# Patient Record
Sex: Male | Born: 1965 | Race: White | Hispanic: No | Marital: Married | Smoking: Never smoker
Health system: Southern US, Community
[De-identification: ages and names within clinical notes are randomized; demographics above are authoritative.]

## PROBLEM LIST (undated history)

## (undated) DIAGNOSIS — I10 Essential (primary) hypertension: Secondary | ICD-10-CM

## (undated) HISTORY — DX: Essential (primary) hypertension: I10

## (undated) HISTORY — PX: OTHER SURGICAL HISTORY: SHX169

---

## 2016-05-03 ENCOUNTER — Emergency Department (HOSPITAL_COMMUNITY): Payer: Managed Care, Other (non HMO)

## 2016-05-03 ENCOUNTER — Encounter (HOSPITAL_COMMUNITY): Payer: Self-pay | Admitting: *Deleted

## 2016-05-03 ENCOUNTER — Emergency Department (HOSPITAL_COMMUNITY)
Admission: EM | Admit: 2016-05-03 | Discharge: 2016-05-03 | Disposition: A | Discharge status: Home / Self Care | Payer: Managed Care, Other (non HMO) | Attending: Emergency Medicine | Admitting: Emergency Medicine

## 2016-05-03 DIAGNOSIS — R1032 Left lower quadrant pain: Secondary | ICD-10-CM | POA: Diagnosis present

## 2016-05-03 DIAGNOSIS — Z79899 Other long term (current) drug therapy: Secondary | ICD-10-CM | POA: Insufficient documentation

## 2016-05-03 DIAGNOSIS — N201 Calculus of ureter: Secondary | ICD-10-CM | POA: Diagnosis not present

## 2016-05-03 LAB — CBC
HEMATOCRIT: 48.1 % (ref 39.0–52.0)
HEMOGLOBIN: 16.9 g/dL (ref 13.0–17.0)
MCH: 30 pg (ref 26.0–34.0)
MCHC: 35.1 g/dL (ref 30.0–36.0)
MCV: 85.4 fL (ref 78.0–100.0)
Platelets: 211 10*3/uL (ref 150–400)
RBC: 5.63 MIL/uL (ref 4.22–5.81)
RDW: 13.8 % (ref 11.5–15.5)
WBC: 8.7 10*3/uL (ref 4.0–10.5)

## 2016-05-03 LAB — URINALYSIS, ROUTINE W REFLEX MICROSCOPIC
BACTERIA UA: NONE SEEN
Bilirubin Urine: NEGATIVE
GLUCOSE, UA: NEGATIVE mg/dL
KETONES UR: NEGATIVE mg/dL
Leukocytes, UA: NEGATIVE
Nitrite: NEGATIVE
PROTEIN: NEGATIVE mg/dL
Specific Gravity, Urine: 1.016 (ref 1.005–1.030)
pH: 5 (ref 5.0–8.0)

## 2016-05-03 LAB — COMPREHENSIVE METABOLIC PANEL
ALBUMIN: 4.3 g/dL (ref 3.5–5.0)
ALK PHOS: 56 U/L (ref 38–126)
ALT: 22 U/L (ref 17–63)
ANION GAP: 14 (ref 5–15)
AST: 24 U/L (ref 15–41)
BILIRUBIN TOTAL: 2 mg/dL — AB (ref 0.3–1.2)
BUN: 10 mg/dL (ref 6–20)
CALCIUM: 9.5 mg/dL (ref 8.9–10.3)
CO2: 18 mmol/L — ABNORMAL LOW (ref 22–32)
CREATININE: 1.05 mg/dL (ref 0.61–1.24)
Chloride: 109 mmol/L (ref 101–111)
GFR calc Af Amer: >60 mL/min (ref >60)
GFR calc non Af Amer: >60 mL/min (ref >60)
GLUCOSE: 136 mg/dL — AB (ref 65–99)
Potassium: 4.1 mmol/L (ref 3.5–5.1)
SODIUM: 141 mmol/L (ref 135–145)
Total Protein: 6.9 g/dL (ref 6.5–8.1)

## 2016-05-03 LAB — LIPASE, BLOOD: Lipase: 41 U/L (ref 11–51)

## 2016-05-03 MED ORDER — ONDANSETRON HCL 4 MG/2ML IJ SOLN
4.0000 mg | Freq: Once | INTRAMUSCULAR | Status: AC
  Administered 2016-05-03: 4 mg via INTRAVENOUS
  Filled 2016-05-03: qty 2

## 2016-05-03 MED ORDER — OXYCODONE-ACETAMINOPHEN 5-325 MG PO TABS: 1.0000 | ORAL_TABLET | Freq: Four times a day (QID) | ORAL | 0 refills | Status: DC | PRN

## 2016-05-03 MED ORDER — TAMSULOSIN HCL 0.4 MG PO CAPS: 0.4000 mg | ORAL_CAPSULE | Freq: Every day | ORAL | 0 refills | Status: DC

## 2016-05-03 MED ORDER — ONDANSETRON 4 MG PO TBDP: 4.0000 mg | ORAL_TABLET | Freq: Three times a day (TID) | ORAL | 0 refills | Status: DC | PRN

## 2016-05-03 MED ORDER — HYDROMORPHONE HCL 2 MG/ML IJ SOLN
1.0000 mg | Freq: Once | INTRAMUSCULAR | Status: AC
  Administered 2016-05-03: 1 mg via INTRAVENOUS
  Filled 2016-05-03: qty 1

## 2016-05-03 MED ORDER — FENTANYL CITRATE (PF) 100 MCG/2ML IJ SOLN
50.0000 ug | INTRAMUSCULAR | Status: DC | PRN
  Administered 2016-05-03: 50 ug via INTRAVENOUS

## 2016-05-03 MED ORDER — FENTANYL CITRATE (PF) 100 MCG/2ML IJ SOLN
INTRAMUSCULAR | Status: AC
  Filled 2016-05-03: qty 2

## 2016-05-03 NOTE — ED Notes (Signed)
Pt returned from CT °

## 2016-05-03 NOTE — ED Provider Notes (Signed)
[facility]   CSN: [id number] Arrival date & time: 05/03/16  0749     History   Chief Complaint Chief Complaint  Patient presents with  . Abdominal Pain    HPI Cameron Mccormick is a 51 y.o. male.  HPI  Pt presenting with c/o acute onset of left lower abdominal pain.  Pain started at approx 530am and has been constant since that time.  Pt states he cannot get comfortable.  Denies dysuria or difficulty urinating.  Has had one episode of emesis.  No fever/chills.  Has not had similar pain in the past.  There are no other associated systemic symptoms, there are no other alleviating or modifying factors. Pain is sharp and stabbing. He has not had any treatment prior to arrival.    History reviewed. No pertinent past medical history.  There are no active problems to display for this patient.   No past surgical history on file.     Home Medications    Prior to Admission medications   Medication Sig Start Date End Date Taking? Authorizing Provider  Fexofenadine-Pseudoephedrine (ALLEGRA-D PO) Take 1 tablet by mouth daily.   Yes Historical Provider, MD  fluticasone (FLONASE) 50 MCG/ACT nasal spray Place 1 spray into both nostrils daily.   Yes Historical Provider, MD  ranitidine (ZANTAC) 150 MG tablet Take 150 mg by mouth daily as needed for heartburn.   Yes Historical Provider, MD  ondansetron (ZOFRAN ODT) 4 MG disintegrating tablet Take 1 tablet (4 mg total) by mouth every 8 (eight) hours as needed. 05/03/16   Jerelyn ScottMartha Linker, MD  oxyCODONE-acetaminophen (PERCOCET/ROXICET) 5-325 MG tablet Take 1-2 tablets by mouth every 6 (six) hours as needed for severe pain. 05/03/16   Jerelyn ScottMartha Linker, MD  tamsulosin (FLOMAX) 0.4 MG CAPS capsule Take 1 capsule (0.4 mg total) by mouth daily. 05/03/16   Jerelyn ScottMartha Linker, MD    Family History No family history on file.  Social History Social History  Substance Use Topics  . Smoking status: Not on file  . Smokeless tobacco: Not  on file  . Alcohol use Not on file     Allergies   Patient has no known allergies.   Review of Systems Review of Systems  ROS reviewed and all otherwise negative except for mentioned in HPI   Physical Exam Updated Vital Signs BP 133/94 (BP Location: Left Arm)   Pulse 72   Temp 97.9 F (36.6 C) (Oral)   Resp 18   SpO2 97%  Vitals reviewed Physical Exam Physical Examination: General appearance - alert, uncomfortableappearing, writhing in pain, and in no distress Mental status - alert, oriented to person, place, and time Eyes -no conjuntival injection, no scleral icterus Chest - clear to auscultation, no wheezes, rales or rhonchi, symmetric air entry Heart - normal rate, regular rhythm, normal S1, S2, no murmurs, rubs, clicks or gallops Abdomen - soft, nontender, nondistended, no masses or organomegaly Neurological - alert, oriented, normal speech,  Extremities - peripheral pulses normal, no pedal edema, no clubbing or cyanosis Skin - normal coloration and turgor, no rashes  ED Treatments / Results  Labs (all labs ordered are listed, but only abnormal results are displayed) Labs Reviewed  COMPREHENSIVE METABOLIC PANEL - Abnormal; Notable for the following:       Result Value   CO2 18 (*)    Glucose, Bld 136 (*)    Total Bilirubin 2.0 (*)    All other components within normal limits  URINALYSIS, ROUTINE W REFLEX  MICROSCOPIC - Abnormal; Notable for the following:    Hgb urine dipstick MODERATE (*)    Squamous Epithelial / LPF 0-5 (*)    All other components within normal limits  URINE CULTURE  CBC  LIPASE, BLOOD    EKG  EKG Interpretation None       Radiology Ct Renal Stone Study  Result Date: 05/03/2016 CLINICAL DATA:  Left lower quadrant pain. EXAM: CT ABDOMEN AND PELVIS WITHOUT CONTRAST TECHNIQUE: Multidetector CT imaging of the abdomen and pelvis was performed following the standard protocol without IV contrast. COMPARISON:  None. FINDINGS: Lower chest: 5  mm nodule in the right lung base posteriorly. Dependent atelectasis bilaterally. Heart is borderline in size. No effusions. Hepatobiliary: No focal hepatic abnormality. Gallbladder unremarkable. Pancreas: No focal abnormality or ductal dilatation. Spleen: No focal abnormality.  Normal size. Adrenals/Urinary Tract: Mild fullness of the left renal collecting system and ureter. No visible renal or ureteral stones bilaterally. Adrenal glands and urinary bladder are unremarkable. Stomach/Bowel: Normal appendix. Stomach, large and small bowel grossly unremarkable. Vascular/Lymphatic: No evidence of aneurysm or adenopathy. Reproductive: No visible focal abnormality. Other: No free fluid or free air. Musculoskeletal: No acute bony abnormality. IMPRESSION: Mild fullness of the left renal collecting system and ureter without visible renal or ureteral stones. Question recent passage of stone. 5 mm right lower lobe pulmonary nodule. No follow-up needed if patient is low-risk. Non-contrast chest CT can be considered in 12 months if patient is high-risk. This recommendation follows the consensus statement: Guidelines for Management of Incidental Pulmonary Nodules Detected on CT Images: From the Fleischner Society 2017; Radiology 2017; 284:228-243. Electronically Signed   By: Charlett Nose M.D.   On: 05/03/2016 09:52    Procedures Procedures (including critical care time)  Medications Ordered in ED Medications  HYDROmorphone (DILAUDID) injection 1 mg (1 mg Intravenous Given 05/03/16 0833)  ondansetron (ZOFRAN) injection 4 mg (4 mg Intravenous Given 05/03/16 1610)     Initial Impression / Assessment and Plan / ED Course  I have reviewed the triage vital signs and the nursing notes.  Pertinent labs & imaging results that were available during my care of the patient were reviewed by me and considered in my medical decision making (see chart for details).     Pt presenting with acute onset of sharp left lower  abdominal pain, labs reveal rbcs in urine, otherwise reassuring. CT scan shows fullness of left collecting system possibly small stone or has passed stone.  On multiple rechecks patient feels much improved after meds.  Given information for urology follwoup, pain and nuasea meds for home use.  Discharged with strict return precautions.  Pt agreeable with plan.  Final Clinical Impressions(s) / ED Diagnoses   Final diagnoses:  Left ureteral stone    New Prescriptions Discharge Medication List as of 05/03/2016 12:08 PM    START taking these medications   Details  ondansetron (ZOFRAN ODT) 4 MG disintegrating tablet Take 1 tablet (4 mg total) by mouth every 8 (eight) hours as needed., Starting Sat 05/03/2016, Print    oxyCODONE-acetaminophen (PERCOCET/ROXICET) 5-325 MG tablet Take 1-2 tablets by mouth every 6 (six) hours as needed for severe pain., Starting Sat 05/03/2016, Print    tamsulosin (FLOMAX) 0.4 MG CAPS capsule Take 1 capsule (0.4 mg total) by mouth daily., Starting Sat 05/03/2016, Print         Jerelyn Scott, MD 05/04/16 [id number]

## 2016-05-03 NOTE — Discharge Instructions (Signed)
Return to the ED with any concerns including worsening pain that is not controlled by pain medications, vomiting and not able to keep down liquids, fever/chills, decreased level of alertness/lethargy, or any other aalrming symptoms   5 mm right lower lobe pulmonary nodule. No follow-up needed if patient is low-risk. Non-contrast chest CT can be considered in 12 months if patient is high-risk. This recommendation follows the consensus statement: Guidelines for Management of Incidental Pulmonary Nodules Detected on CT Images: From the Fleischner Society 2017; Radiology 2017; 284:228-243.

## 2016-05-03 NOTE — ED Notes (Signed)
ED Provider at bedside. 

## 2016-05-03 NOTE — ED Triage Notes (Signed)
Pt. Having LLQ abdominal pain.  Pain started at 530 this morning.  Pt. Very uncomfortable in triage.

## 2016-05-03 NOTE — ED Notes (Signed)
Janan HalterJamie Blue, Charge RN instructed us to place pt. In [address]

## 2016-05-03 NOTE — ED Notes (Signed)
Pt ambulated to restroom  Without difficulty.

## 2016-05-04 LAB — URINE CULTURE

## 2018-06-16 IMAGING — CT CT RENAL STONE PROTOCOL
2 of 4 series · 11 of 46 positions shown, 12 images · non-contrast
Comparison: None.

CLINICAL DATA: Left lower quadrant pain.

EXAM:
CT ABDOMEN AND PELVIS WITHOUT CONTRAST
TECHNIQUE: Multidetector CT imaging of the abdomen and pelvis was performed
following the standard protocol without IV contrast.

[Series 301: stone study, idose (2) · axial · 0.71mm/px · z∈[-860,-405]mm · 8 of 105 slices shown, 9 images]
[im 7/105  soft-tissue]
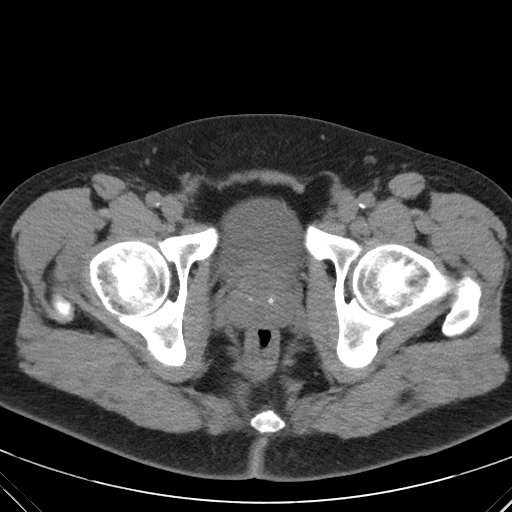
[im 7/105  bone]
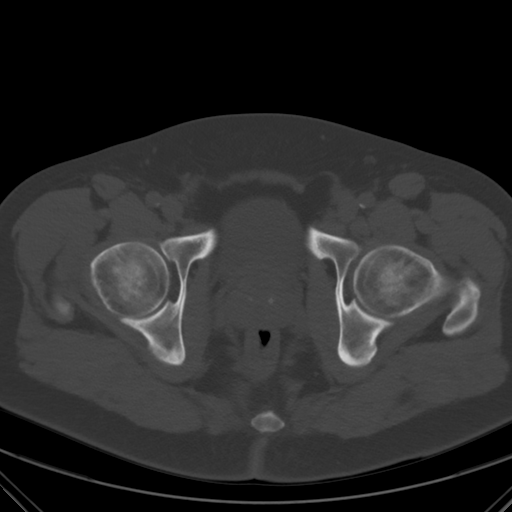
[im 21/105  soft-tissue]
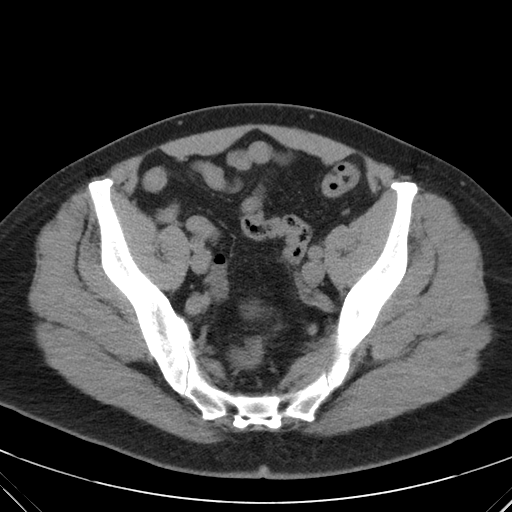
[im 34/105  soft-tissue]
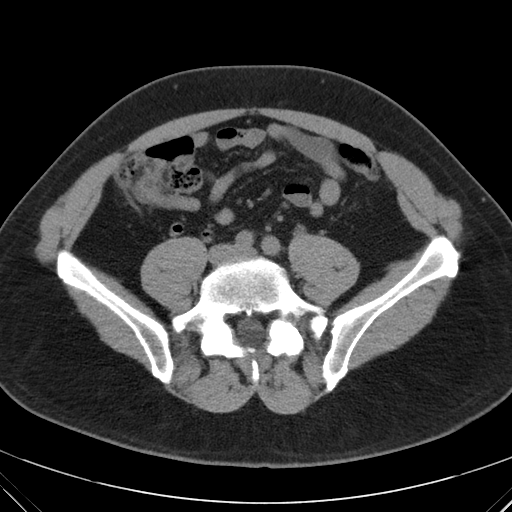
[im 47/105  soft-tissue]
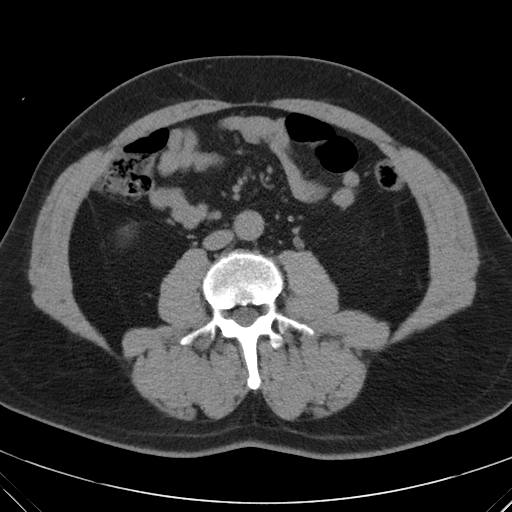
[im 58/105  soft-tissue]
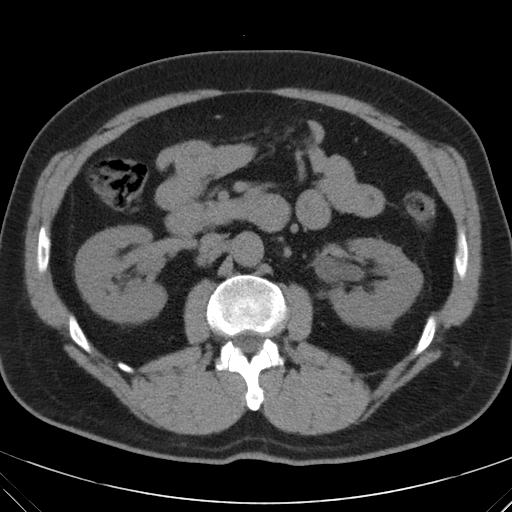
[im 71/105  soft-tissue]
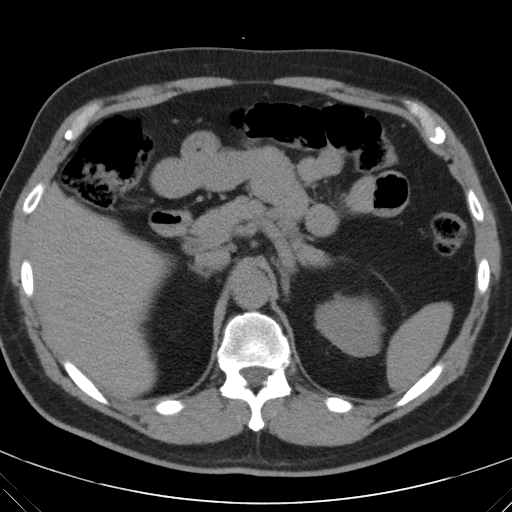
[im 84/105  soft-tissue]
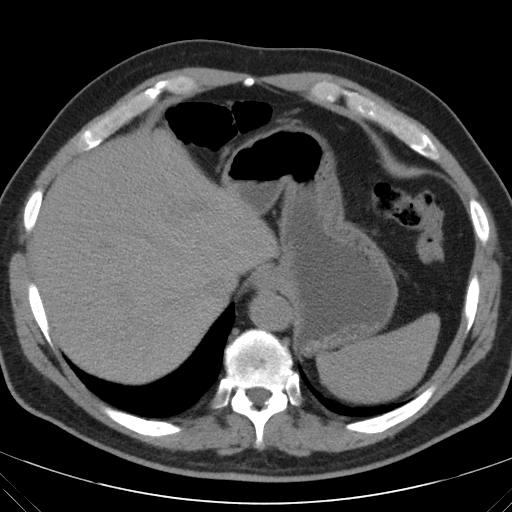
[im 98/105  soft-tissue]
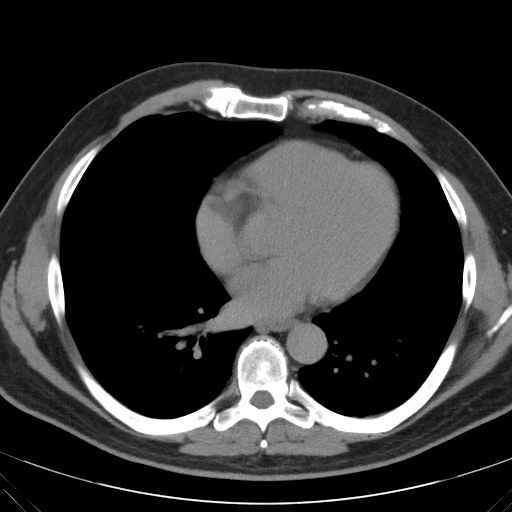

[Series 303: coronals, idose (2) · coronal · 0.45mm/px · 3 of 137 slices shown]
[im 46/137  soft-tissue]
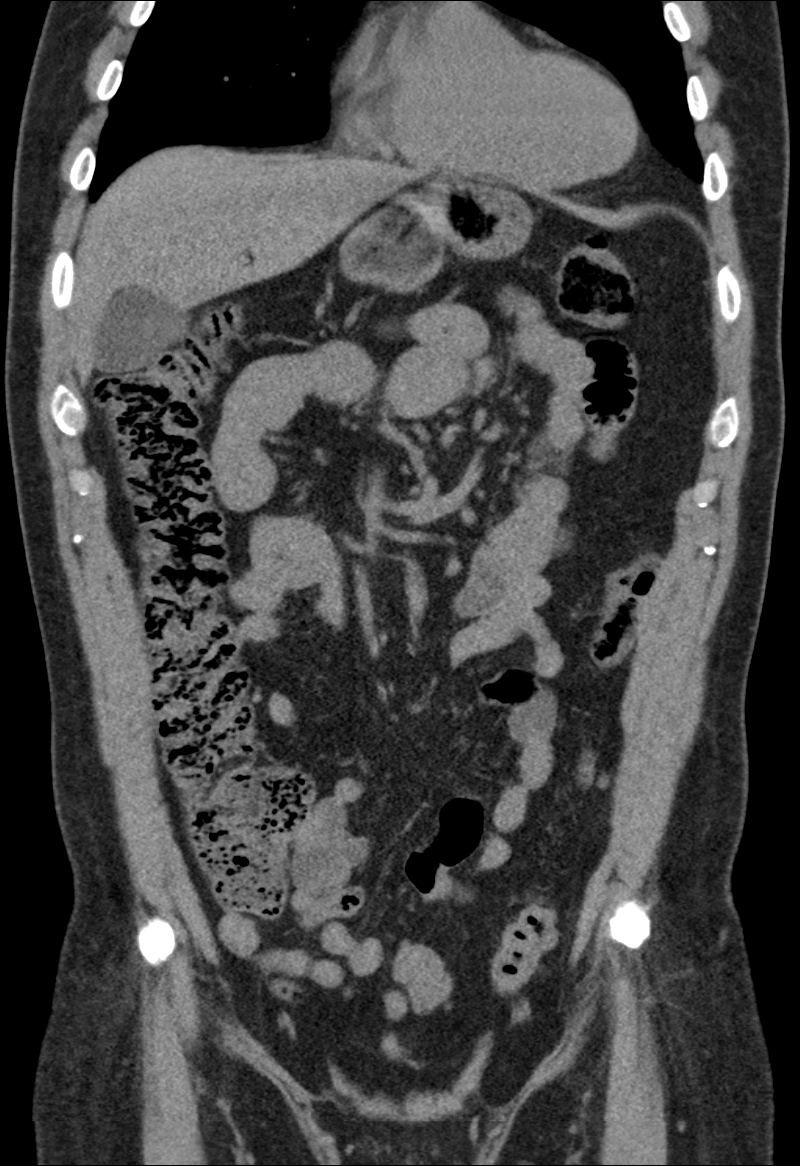
[im 61/137  soft-tissue]
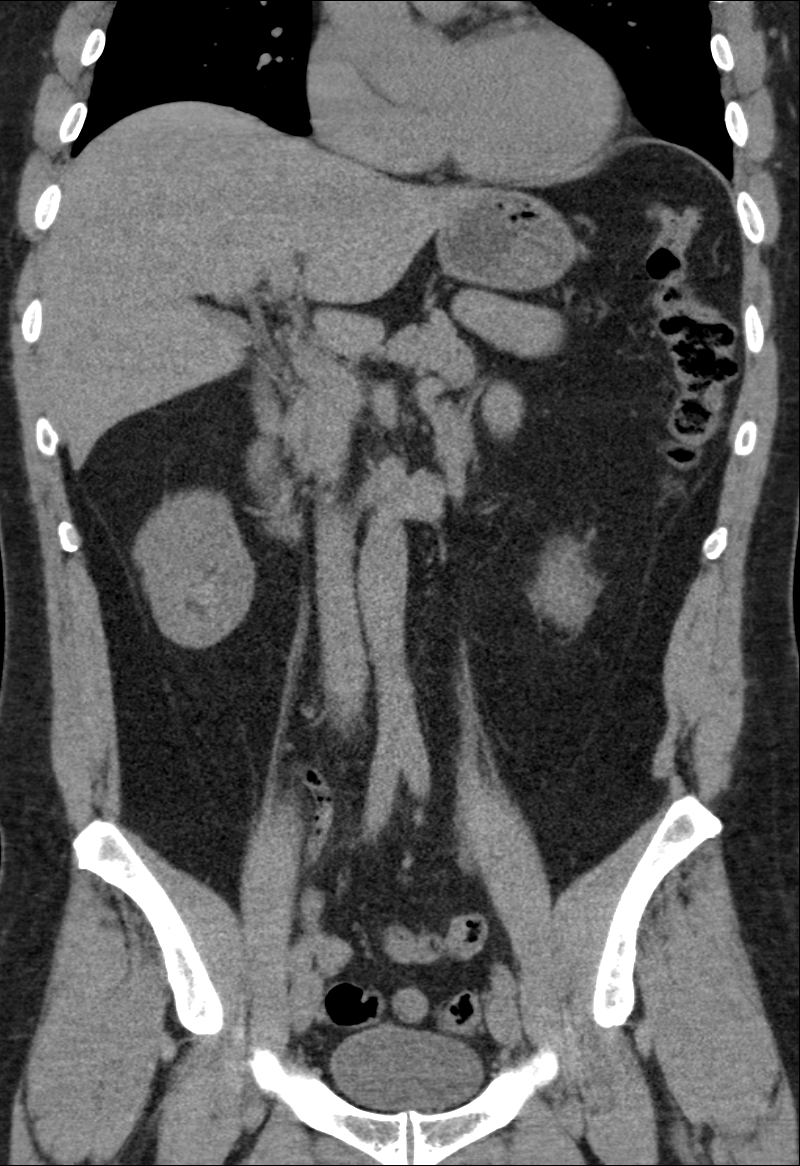
[im 76/137  soft-tissue]
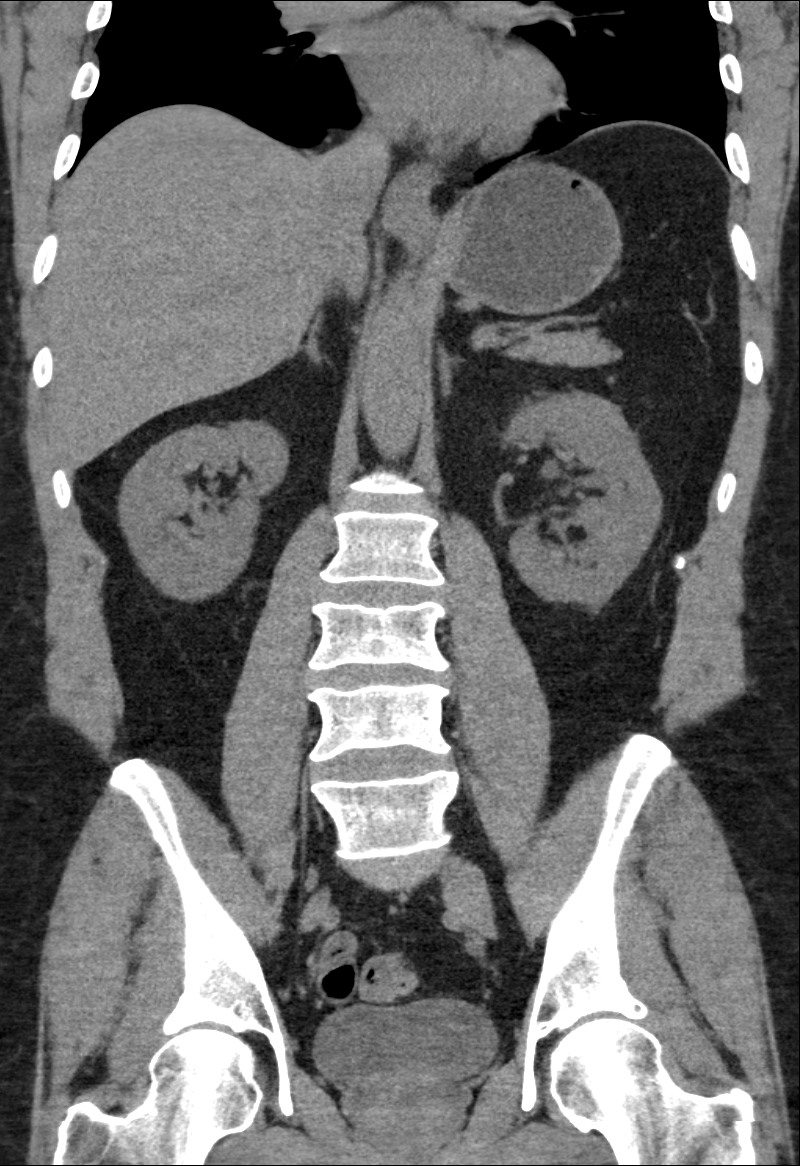

[11 of 46 positions shown; findings below may reference images not displayed]

FINDINGS: Lower chest: 5 mm nodule in the right lung base posteriorly.
Dependent atelectasis bilaterally. Heart is borderline in size. No
effusions.

Hepatobiliary: No focal hepatic abnormality. Gallbladder
unremarkable.

Pancreas: No focal abnormality or ductal dilatation.

Spleen: No focal abnormality.  Normal size.

Adrenals/Urinary Tract: Mild fullness of the left renal collecting
system and ureter. No visible renal or ureteral stones bilaterally.
Adrenal glands and urinary bladder are unremarkable.

Stomach/Bowel: Normal appendix. Stomach, large and small bowel
grossly unremarkable.

Vascular/Lymphatic: No evidence of aneurysm or adenopathy.

Reproductive: No visible focal abnormality.

Other: No free fluid or free air.

Musculoskeletal: No acute bony abnormality.
IMPRESSION: Mild fullness of the left renal collecting system and ureter without
visible renal or ureteral stones. Question recent passage of stone.

5 mm right lower lobe pulmonary nodule. No follow-up needed if
patient is low-risk. Non-contrast chest CT can be considered in 12
months if patient is high-risk. This recommendation follows the
consensus statement: Guidelines for Management of Incidental
Pulmonary Nodules Detected on CT Images: From the [HOSPITAL]

## 2020-02-10 ENCOUNTER — Ambulatory Visit: Payer: 59 | Admitting: Podiatry

## 2020-02-10 ENCOUNTER — Encounter: Payer: Self-pay | Admitting: Podiatry

## 2020-02-10 ENCOUNTER — Other Ambulatory Visit: Payer: Self-pay

## 2020-02-10 ENCOUNTER — Ambulatory Visit (INDEPENDENT_AMBULATORY_CARE_PROVIDER_SITE_OTHER): Payer: 59

## 2020-02-10 DIAGNOSIS — M722 Plantar fascial fibromatosis: Secondary | ICD-10-CM | POA: Diagnosis not present

## 2020-02-10 MED ORDER — DICLOFENAC SODIUM 75 MG PO TBEC: 75.0000 mg | DELAYED_RELEASE_TABLET | Freq: Two times a day (BID) | ORAL | 2 refills | Status: DC

## 2020-02-10 NOTE — Patient Instructions (Signed)

## 2020-02-14 NOTE — Progress Notes (Signed)
Subjective:   Patient ID: Cameron Mccormick, male   DOB: 54 y.o.   MRN: [id number]   HPI Patient states that he has had heel pain which is gotten worse over the last month.  States the right is worse than the left and states that it is been very sore especially when he tries to be active.  Patient does not smoke   Review of Systems  All other systems reviewed and are negative.       Objective:  Physical Exam Vitals and nursing note reviewed.  Constitutional:      Appearance: He is well-developed.  Pulmonary:     Effort: Pulmonary effort is normal.  Musculoskeletal:        General: Normal range of motion.  Skin:    General: Skin is warm.  Neurological:     Mental Status: He is alert.     Neurovascular status was found to be intact muscle strength was adequate range of motion adequate.  Patient is found to have exquisite discomfort plantar aspect right heel at the insertional point of the tendon into the calcaneus a fairly narrow heel and moderate cavus foot structure     Assessment:  Acute plantar fasciitis right with inflammation fluid buildup     Plan:  H&P reviewed condition and educated him.  Today sterile prep done injected the fascia 3 mg Kenalog 5 mg 5 mg Xylocaine and inst spur formation no indication stress fracture ructed him on stretching exercises shoe gear modifications and inserts.  Reappoint to recheck as needed  X-rays indicate moderate high arch foot structure small

## 2020-02-24 ENCOUNTER — Encounter: Payer: Self-pay | Admitting: Podiatry

## 2020-02-24 ENCOUNTER — Ambulatory Visit: Payer: 59 | Admitting: Podiatry

## 2020-02-24 ENCOUNTER — Other Ambulatory Visit: Payer: Self-pay

## 2020-02-24 DIAGNOSIS — M722 Plantar fascial fibromatosis: Secondary | ICD-10-CM

## 2020-02-24 MED ORDER — TRIAMCINOLONE ACETONIDE 10 MG/ML IJ SUSP
10.0000 mg | Freq: Once | INTRAMUSCULAR | Status: AC
  Administered 2020-02-24: 10 mg

## 2020-02-27 NOTE — Progress Notes (Signed)
Subjective:   Patient ID: Cameron Mccormick, male   DOB: 54 y.o.   MRN: [id number]   HPI Patient states overall doing well but still getting some pain in the plantar aspect of the heel   ROS      Objective:  Physical Exam  Neurovascular status intact with discomfort which is improved quite well but there is 1 area pain still noted plantar heel     Assessment:  Plantar fasciitis improved still present right     Plan:  Sterile prep done and injected the plantar fascial right 3 mg Kenalog 5 mg Xylocaine applied sterile dressing and instructed on exercises and shoe gear modification.  Reappoint to recheck as needed

## 2020-10-20 ENCOUNTER — Other Ambulatory Visit: Payer: Self-pay

## 2020-10-20 ENCOUNTER — Emergency Department (HOSPITAL_COMMUNITY)
Admission: EM | Admit: 2020-10-20 | Discharge: 2020-10-20 | Disposition: A | Discharge status: Home / Self Care | Payer: 59 | Attending: Emergency Medicine | Admitting: Emergency Medicine

## 2020-10-20 ENCOUNTER — Emergency Department (HOSPITAL_COMMUNITY): Payer: 59

## 2020-10-20 DIAGNOSIS — R55 Syncope and collapse: Secondary | ICD-10-CM | POA: Diagnosis present

## 2020-10-20 DIAGNOSIS — F17220 Nicotine dependence, chewing tobacco, uncomplicated: Secondary | ICD-10-CM | POA: Insufficient documentation

## 2020-10-20 DIAGNOSIS — I10 Essential (primary) hypertension: Secondary | ICD-10-CM | POA: Diagnosis not present

## 2020-10-20 DIAGNOSIS — Z79899 Other long term (current) drug therapy: Secondary | ICD-10-CM | POA: Insufficient documentation

## 2020-10-20 DIAGNOSIS — R7989 Other specified abnormal findings of blood chemistry: Secondary | ICD-10-CM | POA: Diagnosis not present

## 2020-10-20 DIAGNOSIS — Z8616 Personal history of COVID-19: Secondary | ICD-10-CM | POA: Insufficient documentation

## 2020-10-20 LAB — URINALYSIS, ROUTINE W REFLEX MICROSCOPIC
Bilirubin Urine: NEGATIVE
Glucose, UA: NEGATIVE mg/dL
Hgb urine dipstick: NEGATIVE
Ketones, ur: 20 mg/dL — AB
Leukocytes,Ua: NEGATIVE
Nitrite: NEGATIVE
Protein, ur: NEGATIVE mg/dL
Specific Gravity, Urine: 1.019 (ref 1.005–1.030)
pH: 5 (ref 5.0–8.0)

## 2020-10-20 LAB — BASIC METABOLIC PANEL
Anion gap: 11 (ref 5–15)
BUN: 15 mg/dL (ref 6–20)
CO2: 23 mmol/L (ref 22–32)
Calcium: 9.6 mg/dL (ref 8.9–10.3)
Chloride: 105 mmol/L (ref 98–111)
Creatinine, Ser: 1.63 mg/dL — ABNORMAL HIGH (ref 0.61–1.24)
GFR, Estimated: 49 mL/min — ABNORMAL LOW (ref >60)
Glucose, Bld: 114 mg/dL — ABNORMAL HIGH (ref 70–99)
Potassium: 4.2 mmol/L (ref 3.5–5.1)
Sodium: 139 mmol/L (ref 135–145)

## 2020-10-20 LAB — CBC
HCT: 47.4 % (ref 39.0–52.0)
Hemoglobin: 16.2 g/dL (ref 13.0–17.0)
MCH: 30.2 pg (ref 26.0–34.0)
MCHC: 34.2 g/dL (ref 30.0–36.0)
MCV: 88.4 fL (ref 80.0–100.0)
Platelets: 225 10*3/uL (ref 150–400)
RBC: 5.36 MIL/uL (ref 4.22–5.81)
RDW: 13.8 % (ref 11.5–15.5)
WBC: 11.8 10*3/uL — ABNORMAL HIGH (ref 4.0–10.5)
nRBC: 0 % (ref 0.0–0.2)

## 2020-10-20 LAB — TROPONIN I (HIGH SENSITIVITY)
Troponin I (High Sensitivity): 6 ng/L (ref <18)
Troponin I (High Sensitivity): 6 ng/L (ref <18)

## 2020-10-20 LAB — CBG MONITORING, ED: Glucose-Capillary: 104 mg/dL — ABNORMAL HIGH (ref 70–99)

## 2020-10-20 LAB — MAGNESIUM: Magnesium: 1.9 mg/dL (ref 1.7–2.4)

## 2020-10-20 MED ORDER — SODIUM CHLORIDE 0.9 % IV BOLUS
1000.0000 mL | Freq: Once | INTRAVENOUS | Status: AC
  Administered 2020-10-20: 1000 mL via INTRAVENOUS

## 2020-10-20 NOTE — ED Notes (Signed)
Pt offered wheelchair to car, pt stated he wanted to walk.

## 2020-10-20 NOTE — Discharge Instructions (Addendum)
Please drink plenty of water for the next few days and try to stay out of the heat take it easy.  You should follow-up with a cardiologist as we discussed for follow-up evaluation.  I also recommend you contact your doctor to discuss your kidney function (creatinine 1.6 today).  This may be a chronic issue.  We do not have access to the records.  I would recommend that you continue drinking plenty of fluids until then.

## 2020-10-20 NOTE — ED Provider Notes (Signed)
[facility] EMERGENCY DEPARTMENT Provider Note   CSN: [id number] Arrival date & time: 10/20/20  1511     History Chief Complaint  Patient presents with   Loss of Consciousness    Cameron Mccormick is a 55 y.o. male present emergency department near syncope.  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  He was driving a tractor for several hours.  He said he began to feel "just unwell" and overheated in the tractor.  He got out of the tractor and began to feel very lightheaded, stating his vision was turning white.  His wife found him down.  She sprayed him with water.  She says there was a period of time where his eyes seemed to roll back briefly, no seizure activity reported.  There was no complete loss of consciousness.  They called EMS.  They reported that they had difficulty palpating a pressure initially.  Gave the patient a liter of fluids.  We also packed it with ice.  The patient reports that he now feels significantly better in the emergency department.  He says he does often have episodes of feeling lightheaded with exertion, and also with standing up suddenly.  He is on a single blood pressure medication at low-dose because he has high diastolic pressure.  He denies any history of MI, diabetes, high cholesterol.  He denies significant family history of MI.  He does report that he uses chewing tobacco but denies smoking history.  He denies any other medical conditions.  He denies any family history of personal history of aneurysms.  Reports he feels he does not drink enough water daily, normally 1-2 bottles, so the urine tends to be concentrated yellow.  HPI     No past medical history on file.  There are no problems to display for this patient.   No past surgical history on file.     No family history on file.     Home Medications Prior to Admission medications   Medication Sig Start Date End Date Taking?  Authorizing Provider  lisinopril (ZESTRIL) 10 MG tablet Take 10 mg by mouth daily. 10/19/20  Yes [provider]    Allergies    Patient has no known allergies.  Review of Systems   Review of Systems  Constitutional:  Negative for chills and fever.  Eyes:  Negative for pain and visual disturbance.  Respiratory:  Negative for cough and shortness of breath.   Cardiovascular:  Negative for chest pain and palpitations.  Gastrointestinal:  Negative for abdominal pain and vomiting.  Genitourinary:  Negative for dysuria and hematuria.  Musculoskeletal:  Negative for arthralgias, back pain and myalgias.  Skin:  Negative for color change and rash.  Neurological:  Positive for light-headedness. Negative for syncope.  All other systems reviewed and are negative.  Physical Exam Updated Vital Signs BP 122/90   Pulse 69   Temp 98.8 F (37.1 C)   Resp 17   Ht 6\' 2"  (1.88 m)   Wt 113.4 kg   SpO2 100%   BMI 32.10 kg/m   Physical Exam Constitutional:      General: He is not in acute distress. HENT:     Head: Normocephalic and atraumatic.  Eyes:     Conjunctiva/sclera: Conjunctivae normal.     Pupils: Pupils are equal, round, and reactive to light.  Cardiovascular:     Rate and Rhythm: Normal rate and regular rhythm.  Pulses: Normal pulses.  Pulmonary:     Effort: Pulmonary effort is normal. No respiratory distress.  Abdominal:     General: There is no distension.     Tenderness: There is no abdominal tenderness.  Skin:    General: Skin is warm and dry.  Neurological:     General: No focal deficit present.     Mental Status: He is alert and oriented to person, place, and time. Mental status is at baseline.  Psychiatric:        Mood and Affect: Mood normal.        Behavior: Behavior normal.    ED Results / Procedures / Treatments   Labs (all labs ordered are listed, but only abnormal results are displayed) Labs Reviewed  BASIC METABOLIC PANEL - Abnormal; Notable  for the following components:      Result Value   Glucose, Bld 114 (*)    Creatinine, Ser 1.63 (*)    GFR, Estimated 49 (*)    All other components within normal limits  CBC - Abnormal; Notable for the following components:   WBC 11.8 (*)    All other components within normal limits  URINALYSIS, ROUTINE W REFLEX MICROSCOPIC - Abnormal; Notable for the following components:   APPearance HAZY (*)    Ketones, ur 20 (*)    All other components within normal limits  CBG MONITORING, ED - Abnormal; Notable for the following components:   Glucose-Capillary 104 (*)    All other components within normal limits  MAGNESIUM  TROPONIN I (HIGH SENSITIVITY)  TROPONIN I (HIGH SENSITIVITY)    EKG EKG Interpretation  Date/Time:  Saturday October 20 2020 15:22:45 EDT Ventricular Rate:  75 PR Interval:  216 QRS Duration: 100 QT Interval:  392 QTC Calculation: 438 R Axis:   27 Text Interpretation: Sinus rhythm Prolonged PR interval Confirmed by Alvester Chou [phone]) on 10/20/2020 6:39:56 PM  Radiology DG Chest 2 View  Result Date: 10/20/2020 CLINICAL DATA:  Near-syncope EXAM: CHEST - 2 VIEW COMPARISON:  None. FINDINGS: The heart size and mediastinal contours are within normal limits. Both lungs are clear. The visualized skeletal structures are unremarkable. IMPRESSION: No active cardiopulmonary disease. Electronically Signed   By: Gerome Sam III M.D   On: 10/20/2020 16:50    Procedures Procedures   Medications Ordered in ED Medications  sodium chloride 0.9 % bolus 1,000 mL (0 mLs Intravenous Stopped 10/20/20 2124)    ED Course  I have reviewed the triage vital signs and the nursing notes.  Pertinent labs & imaging results that were available during my care of the patient were reviewed by me and considered in my medical decision making (see chart for details).  Near syncope presentation Ddx includes vasovagal episode vs orthostatics vs arrhythmia versus dehydration versus heat  exhaustion versus other.  Glucose is normal on arrival.  EKG on arrival shows normal sinus rhythm with a prolonged PR interval, first-degree AV block, unlikely to be a cause of his symptoms.  His initial troponin is 6.  His labs do show an elevated creatinine at 1.6 although we do not have a recent level.    CBC and hemoglobin levels are stable.  Clinical Course as of 10/20/20 2336  Sat Oct 20, 2020  1600 He reports he had covid 3 months ago and doesn't want to be retested for this. [MT]  2045 He remains asymptomatic in the ED and feels well.  I discussed that he had ketones in the urine, may have some  dehydration component.  Also talked about creatinine number (did not have any recent levels).  He can follow-up with his PCP for this.  Otherwise we will place referral to cardiology to arrange for an echocardiogram for near syncope.  Patient is wife verbalized understanding.  They are both wanting to go home.  I think this is reasonable at this time.  Advised him to stay out of the heat and take it easy for a few days. [MT]    Clinical Course User Index [MT] Virgilia Quigg, Kermit Balo, MD    Final Clinical Impression(s) / ED Diagnoses Final diagnoses:  Near syncope  Elevated serum creatinine    Rx / DC Orders ED Discharge Orders          Ordered    Ambulatory referral to Cardiology       Comments: Near syncope, recommend echocardiogram   10/20/20 2118             Terald Sleeper, MD 10/20/20 2336

## 2020-10-20 NOTE — ED Triage Notes (Addendum)
Pt was working in yard, got light headed, sat down, had one episode of vomiting, EMS was called.  On arrival had trouble getting pressure and radial pulse.  Initial pressure 80 systolic.  Pt has gotten 1 liter NS.  Pt states he feels better at time of triage.

## 2020-10-24 NOTE — Progress Notes (Signed)
Cardiology Office Note:   Date:  10/25/2020  NAME:  Cameron Mccormick    MRN: [id number] DOB:  1966/01/01   PCP:  Blair Heys, MD  Cardiologist:  None  Electrophysiologist:  None   Referring MD: Blair Heys, MD   Chief Complaint  Patient presents with   Dizziness   History of Present Illness:   Cameron Mccormick is a 55 y.o. male with a hx of HTN who is being seen today for the evaluation of dizziness at the request of Blair Heys, MD. he was sent to the emergency room on 10/20/2020 after a presyncopal episode.  He reports around 1:59 PM on the day in question he was working in the yard.  He reports 4 hours of yard work.  He reports the weather was 92 to 93 degrees.  He reports he may have had to small bottles of water to drink.  He reports he did not eat breakfast.  He had been out starting at 9 AM and the episode happened around 2 PM.  He reports that he was working on a tractor and developed some hip pain.  He reports he stood up and got very lightheaded and dizzy.  He reports he felt he was in a pass out and sat on the ground.  He reports his wife gave him water as well as opposed him down.  He reports he felt still poorly.  His wife reports that he laid down and may have lost consciousness briefly.  He reports no chest pain or trouble breathing before the episodes.  No rapid heartbeat sensation.  He was evaluated by EMS.  He was noted to have a thready pulse and BP 80/50.  He was given intravenous fluids and felt better.  He was evaluated emergency room.  Cardiac enzymes are negative.  EKG from the emergency room demonstrated sinus rhythm heart rate 76 with first-degree AV block and no acute ischemic changes or evidence of infarction.  He did have an acute kidney injury with elevated creatinine.  He was clearly dehydrated.  He did not have a heatstroke but likely suffered from heat exhaustion.  He was referred to [place] for further evaluation.  He has a medical history of hypertension.  He  takes lisinopril.  He reports no diabetes.  He is never had a heart attack or stroke.  There is no family history of heart disease.  He reports he does not exercise routinely but does a lot of yard work.  No symptoms with this.  He does not smoke.  He uses smokeless tobacco.  He is to quit this.  He reports he drinks alcohol daily but not in excess.  Possibly 1-2 drinks.  No drug use is reported.  He is married with grown children.  He has several grandchildren.  He has never had any episodes like this in the past.  He has no chest pain or trouble breathing with routine activity.  He seems to be quite healthy otherwise.  Past Medical History: Past Medical History:  Diagnosis Date   Hypertension     Past Surgical History: Past Surgical History:  Procedure Laterality Date   rectal abscess      Current Medications: Current Meds  Medication Sig   diclofenac (VOLTAREN) 75 MG EC tablet Take 75 mg by mouth 2 (two) times daily.   famotidine (PEPCID) 20 MG tablet Take 20 mg by mouth 2 (two) times daily.   Fexofenadine-Pseudoephedrine (ALLEGRA-D 12 HOUR PO) Take by mouth.  lisinopril (ZESTRIL) 10 MG tablet Take 10 mg by mouth daily.     Allergies:    Patient has no known allergies.   Social History: Social History   Socioeconomic History   Marital status: Married    Spouse name: Not on file   Number of children: 3   Years of education: Not on file   Highest education level: Not on file  Occupational History   Not on file  Tobacco Use   Smoking status: Never   Smokeless tobacco: Current  Substance and Sexual Activity   Alcohol use: Yes    Alcohol/week: 7.0 standard drinks    Types: 7 Glasses of wine per week   Drug use: Never   Sexual activity: Not on file  Other Topics Concern   Not on file  Social History Narrative   Not on file   Social Determinants of Health   Financial Resource Strain: Not on file  Food Insecurity: Not on file  Transportation Needs: Not on file   Physical Activity: Not on file  Stress: Not on file  Social Connections: Not on file     Family History: The patient's family history includes Cancer in his mother.  ROS:   All other ROS reviewed and negative. Pertinent positives noted in the HPI.     EKGs/Labs/Other Studies Reviewed:   The following studies were personally reviewed by me today:  Recent Labs: 10/20/2020: BUN 15; Creatinine, Ser 1.63; Hemoglobin 16.2; Magnesium 1.9; Platelets 225; Potassium 4.2; Sodium 139   Recent Lipid Panel No results found for: CHOL, TRIG, HDL, CHOLHDL, VLDL, LDLCALC, LDLDIRECT  Physical Exam:   VS:  BP 122/84 (BP Location: Right Arm, Patient Position: Sitting, Cuff Size: Normal)   Pulse 78   Ht 6\' 2"  (1.88 m)   Wt 258 lb 14.4 oz (117.4 kg)   SpO2 97%   BMI 33.24 kg/m    Wt Readings from Last 3 Encounters:  10/25/20 258 lb 14.4 oz (117.4 kg)  10/20/20 250 lb (113.4 kg)    General: Well nourished, well developed, in no acute distress Head: Atraumatic, normal size  Eyes: PEERLA, EOMI  Neck: Supple, no JVD Endocrine: No thryomegaly Cardiac: Normal S1, S2; RRR; no murmurs, rubs, or gallops Lungs: Clear to auscultation bilaterally, no wheezing, rhonchi or rales  Abd: Soft, nontender, no hepatomegaly  Ext: No edema, pulses 2+ Musculoskeletal: No deformities, BUE and BLE strength normal and equal Skin: Warm and dry, no rashes   Neuro: Alert and oriented to person, place, time, and situation, CNII-XII grossly intact, no focal deficits  Psych: Normal mood and affect   ASSESSMENT:   Cameron Mccormick is a 55 y.o. male who presents for the following: 1. Postural dizziness with presyncope   2. Dehydration    PLAN:   1. Postural dizziness with presyncope 2. Dehydration -He presents after heat exhaustion.  He was working in the yard up to 4 to 5 hours a 90+ degree weather.  He had minimal water to drink.  He had nothing to eat that morning.  He stood up and got very dizzy.  He had low blood  pressure and a thready pulse when EMS arrived.  Symptoms improved with IV hydration.  He had no red flag symptoms such as chest pain or trouble breathing.  Cardiac enzymes were negative for an acute coronary syndrome.  EKG demonstrated sinus rhythm with a first-degree block.  He had no acute ischemic changes.  He had no red flag symptoms.  Cardiovascular  examination is normal today.  He has not had any episode like this in the past.  To me this is clearly heat exhaustion and likely a vasovagal event.  Given that he recovered well with hydration this is more reassuring.  He suffered an acute kidney injury which shows that he was very dehydrated.  He was counseled extensively about adequate hydration as well as avoiding excess alcohol before yardwork.  I think alcohol the night before did dehydrate him as well.  I see no need for further testing given his normal cardiovascular examination as well as normal EKG.  Given lack of any symptoms and no strong family history of heart disease we will see him as needed.  He should follow-up with his primary care physician for further evaluation.  If symptoms happen again we could consider testing but at this point I would not.  This appears to be an isolated event.  Disposition: Return if symptoms worsen or fail to improve.  Medication Adjustments/Labs and Tests Ordered: Current medicines are reviewed at length with the patient today.  Concerns regarding medicines are outlined above.  No orders of the defined types were placed in this encounter.  No orders of the defined types were placed in this encounter.   Patient Instructions   Follow-Up: At [facility], you and your health needs are our priority.  As part of our continuing mission to provide you with exceptional heart care, we have created designated Provider Care Teams.  These Care Teams include your primary Cardiologist (physician) and Advanced Practice Providers (APPs -  Physician Assistants and Nurse  Practitioners) who all work together to provide you with the care you need, when you need it.  We recommend signing up for the patient portal called "MyChart".  Sign up information is provided on this After Visit Summary.  MyChart is used to connect with patients for Virtual Visits (Telemedicine).  Patients are able to view lab/test results, encounter notes, upcoming appointments, etc.  Non-urgent messages can be sent to your provider as well.   To learn more about what you can do with MyChart, go to [email or web address].    Your next appointment:    AS NEEDED    Signed, Gerri Spore T. Flora Lipps, MD, [facility]  [facility]  [address] [place], [place] [address] [phone]  10/25/2020 9:49 AM

## 2020-10-25 ENCOUNTER — Other Ambulatory Visit: Payer: Self-pay

## 2020-10-25 ENCOUNTER — Encounter: Payer: Self-pay | Admitting: Cardiovascular Disease

## 2020-10-25 ENCOUNTER — Ambulatory Visit: Payer: 59 | Admitting: Cardiovascular Disease

## 2020-10-25 VITALS — BP 122/84 | HR 78 | Ht 74.0 in | Wt 258.9 lb

## 2020-10-25 DIAGNOSIS — R42 Dizziness and giddiness: Secondary | ICD-10-CM | POA: Diagnosis not present

## 2020-10-25 DIAGNOSIS — E860 Dehydration: Secondary | ICD-10-CM

## 2020-10-25 DIAGNOSIS — R55 Syncope and collapse: Secondary | ICD-10-CM

## 2020-10-25 NOTE — Patient Instructions (Signed)

## 2021-01-01 ENCOUNTER — Ambulatory Visit: Payer: 59 | Admitting: Cardiology

## 2021-01-30 ENCOUNTER — Encounter: Payer: Self-pay | Admitting: Podiatry

## 2021-01-30 ENCOUNTER — Ambulatory Visit (INDEPENDENT_AMBULATORY_CARE_PROVIDER_SITE_OTHER): Payer: 59

## 2021-01-30 ENCOUNTER — Ambulatory Visit: Payer: 59 | Admitting: Podiatry

## 2021-01-30 ENCOUNTER — Other Ambulatory Visit: Payer: Self-pay

## 2021-01-30 DIAGNOSIS — M722 Plantar fascial fibromatosis: Secondary | ICD-10-CM

## 2021-01-30 MED ORDER — PREDNISONE 10 MG PO TABS: ORAL_TABLET | ORAL | 0 refills | Status: AC

## 2021-01-30 MED ORDER — TRIAMCINOLONE ACETONIDE 10 MG/ML IJ SUSP
10.0000 mg | Freq: Once | INTRAMUSCULAR | Status: AC
  Administered 2021-01-30: 10 mg

## 2021-01-30 NOTE — Progress Notes (Signed)
Subjective:   Patient ID: Cameron Mccormick, male   DOB: 55 y.o.   MRN: [id number]   HPI Patient presents stating the pain in the right heel has been severe and admits that it only lasted around a month after last visit became painful after that and over the last 6 months has been very sore making it very difficult for him to be active or do any forms of activity.  Patient states it is getting worse all the time neuro   ROS      Objective:  Physical Exam  Vascular status intact muscle strength adequate exquisite discomfort plantar aspect right heel at the insertional point of the tendon into the calcaneus with inflammation fluid     Assessment:  Acute plantar fasciitis right with inflammation fluid present     Plan:  H&P due to intensity of discomfort I did sterile prep and injected the fascia 3 mg Kenalog 5 mg Xylocaine I applied a night splint in order to immobilize the plantar foot and advised on using this for approximately 3 weeks with possible long-term night splint and possible orthotic therapy.  Reappoint to reevaluate in 4 weeks with gradual weaning off of the cam walker in 3 weeks  X-rays were negative for signs of fracture did indicate spur formation no other pathology

## 2021-02-20 ENCOUNTER — Encounter: Payer: Self-pay | Admitting: Podiatry

## 2021-02-20 ENCOUNTER — Ambulatory Visit: Payer: 59 | Admitting: Podiatry

## 2021-02-20 ENCOUNTER — Other Ambulatory Visit: Payer: Self-pay

## 2021-02-20 DIAGNOSIS — M722 Plantar fascial fibromatosis: Secondary | ICD-10-CM | POA: Diagnosis not present

## 2021-02-20 NOTE — Progress Notes (Signed)
Subjective:   Patient ID: Cameron Mccormick, male   DOB: 55 y.o.   MRN: [id number]   HPI Patient presents stating my right heel has been so sore and this is been going on for so long.  States immobilization only helped him temporarily and making no difference now and he is tired of the pain he is experiencing   ROS      Objective:  Physical Exam  Neurovascular status intact chronic heel pain right that is been treated for extended period of time with minimal success with increased discomfort over the last 6 months     Assessment:  Chronic with acute manifestations right plantar fasciitis with failure to respond conservatively     Plan:  H&P reviewed condition discussed with him at great length.  I do think that consideration at this point is for correction and I did review with him surgical intervention in this case with endoscopic release.  Patient wants surgery and I allowed him to read consent form going over alternative treatments complications and explained to him the possibility for arch pain or other discomfort which can last for periods of time.  He is willing to accept all of this risk signed consent form understands recovery can take upwards of 6 months and is scheduled for outpatient surgery with all questions answered today

## 2021-03-01 ENCOUNTER — Telehealth: Payer: Self-pay | Admitting: Urology

## 2021-03-01 NOTE — Telephone Encounter (Signed)
DOS - 03/19/21  EPF RIGHT --- [id number]  [employer] EFFECTIVE DATE - 03/24/16  PER [employer]'S AUTOMATIVE SYSTEM FOR CPT CODE 79150 NO PRIOR AUTH IS REQUIRED.  REF # [id number]

## 2021-03-18 MED ORDER — HYDROCODONE-ACETAMINOPHEN 10-325 MG PO TABS: 1.0000 | ORAL_TABLET | Freq: Three times a day (TID) | ORAL | 0 refills | Status: AC | PRN

## 2021-03-18 NOTE — Addendum Note (Signed)
Addended by: Lenn Sink on: 03/18/2021 10:07 PM   Modules accepted: Orders

## 2021-03-19 ENCOUNTER — Encounter: Payer: Self-pay | Admitting: Podiatry

## 2021-03-19 DIAGNOSIS — M722 Plantar fascial fibromatosis: Secondary | ICD-10-CM | POA: Diagnosis not present

## 2021-03-27 ENCOUNTER — Ambulatory Visit (INDEPENDENT_AMBULATORY_CARE_PROVIDER_SITE_OTHER): Payer: 59 | Admitting: Podiatry

## 2021-03-27 ENCOUNTER — Other Ambulatory Visit: Payer: Self-pay

## 2021-03-27 ENCOUNTER — Encounter: Payer: Self-pay | Admitting: Podiatry

## 2021-03-27 DIAGNOSIS — M722 Plantar fascial fibromatosis: Secondary | ICD-10-CM

## 2021-03-27 NOTE — Progress Notes (Signed)
Subjective:   Patient ID: Cameron Mccormick, male   DOB: 56 y.o.   MRN: [id number]   HPI F patient presents stating he is doing very well minimal discomfort and able to walk on his boot   ROS      Objective:  Physical Exam  Alert status intact negative Denna Haggard' sign noted wound edges coapted well     Assessment:  Doing well post endoscopic surgery right     Plan:  Sterile dressing reapplied and continue immobilization and boot, shoe usage. Reappoint 2 weeks for suture removal and will not need to be seen back after unless has problems

## 2021-04-15 ENCOUNTER — Encounter: Payer: Self-pay | Admitting: Podiatry

## 2021-04-15 ENCOUNTER — Other Ambulatory Visit: Payer: Self-pay

## 2021-04-15 ENCOUNTER — Ambulatory Visit (INDEPENDENT_AMBULATORY_CARE_PROVIDER_SITE_OTHER): Payer: 59 | Admitting: Podiatry

## 2021-04-15 DIAGNOSIS — B351 Tinea unguium: Secondary | ICD-10-CM | POA: Diagnosis not present

## 2021-04-15 DIAGNOSIS — M722 Plantar fascial fibromatosis: Secondary | ICD-10-CM | POA: Diagnosis not present

## 2021-04-15 MED ORDER — TERBINAFINE HCL 250 MG PO TABS: 250.0000 mg | ORAL_TABLET | Freq: Every day | ORAL | 0 refills | Status: AC

## 2021-04-17 NOTE — Progress Notes (Signed)
Subjective:   Patient ID: Cameron Mccormick, male   DOB: 56 y.o.   MRN: [id number]   HPI Patient states doing very well with surgery very pleased just getting back into shoes now   ROS      Objective:  Physical Exam  Neurovascular status intact negative Denna Haggard' sign noted wound edges coapted well stitches intact     Assessment:  Doing well post endoscopic surgery right fascia medial band     Plan:  Stitches removed compression and discussed gradual increase in activities and symptoms to watch out for along with boot usage as needed.  Patient to be seen back as needed

## 2022-12-03 IMAGING — DX DG CHEST 2V
2 series · 2 of 2 positions shown · non-contrast
Comparison: None.

CLINICAL DATA: Near-syncope

EXAM:
CHEST - 2 VIEW

[chest ap]
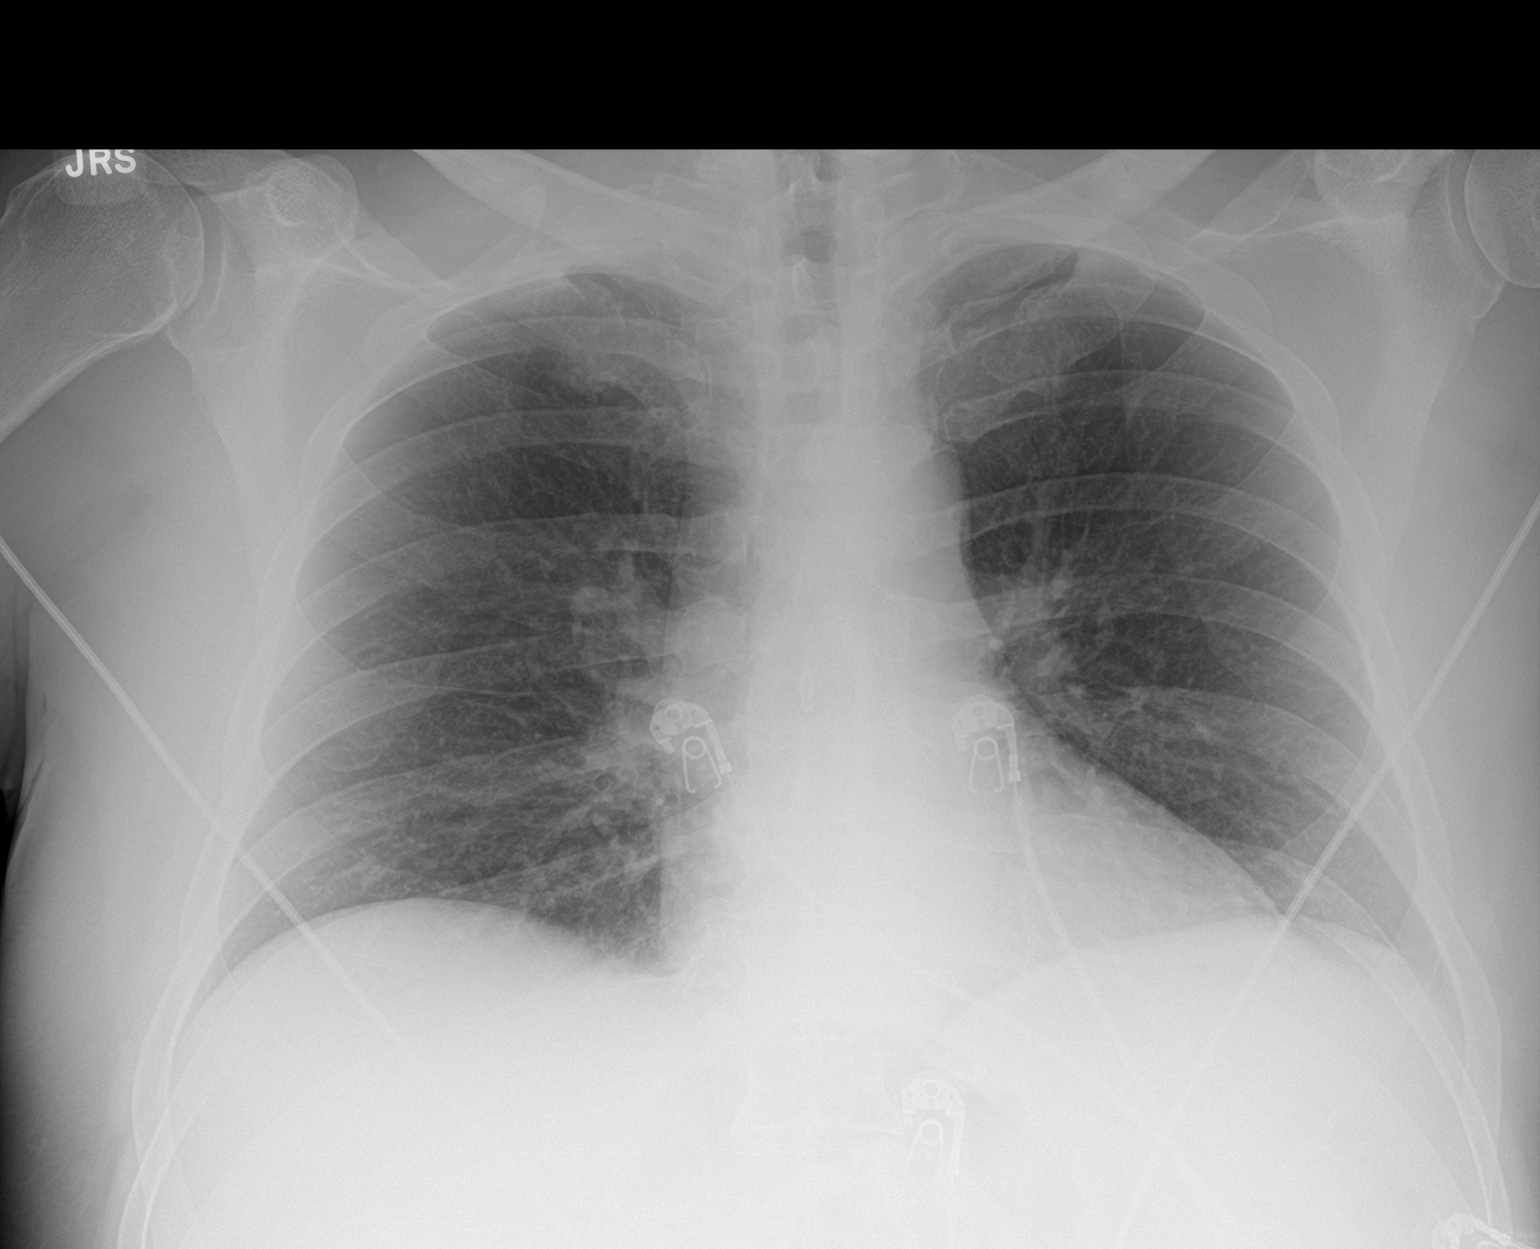

[chest lat]
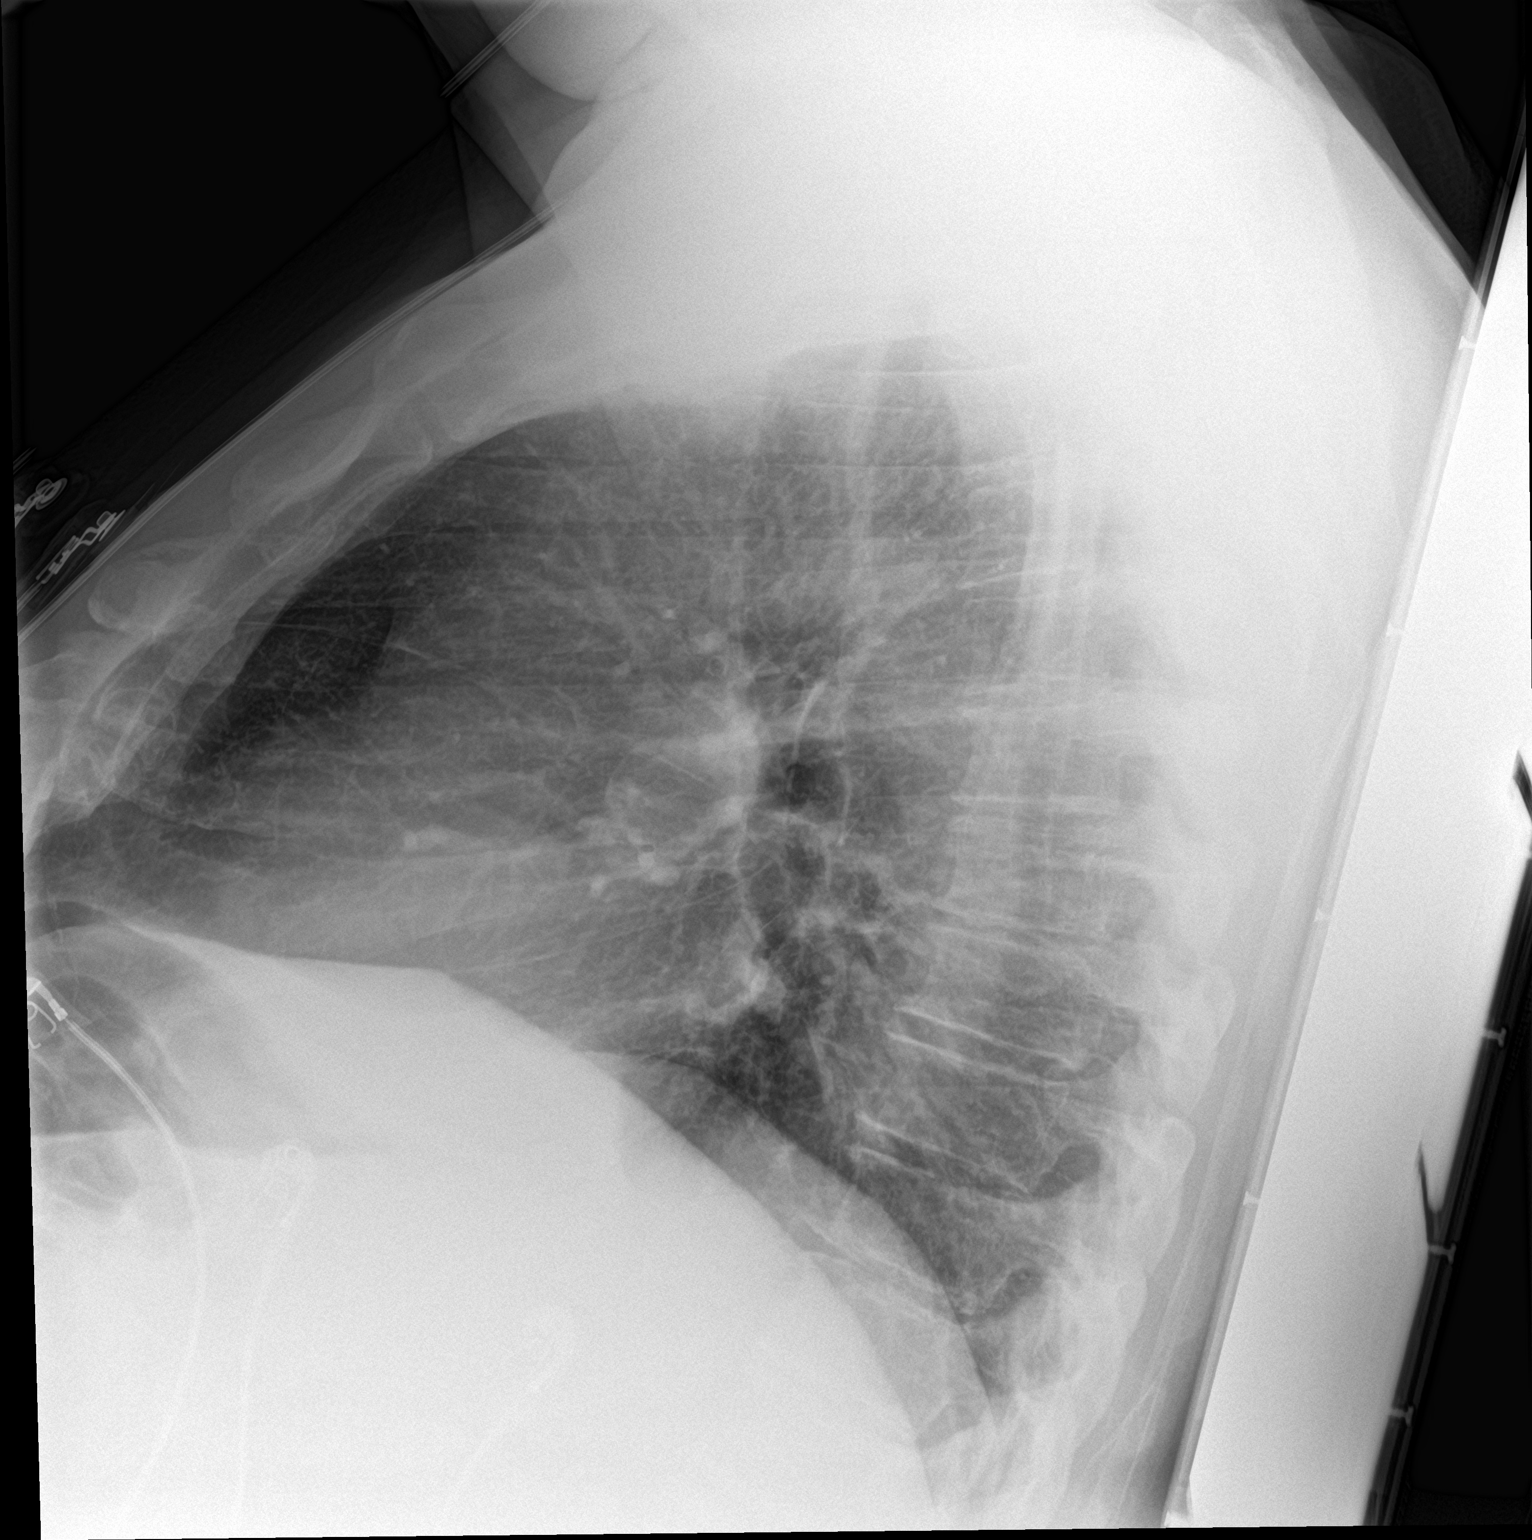

[2 of 2 positions shown; findings below may reference images not displayed]

FINDINGS: The heart size and mediastinal contours are within normal limits.
Both lungs are clear. The visualized skeletal structures are
unremarkable.
IMPRESSION: No active cardiopulmonary disease.
